# Patient Record
Sex: Female | Born: 1980 | Race: White | Hispanic: No | Marital: Single | Smoking: Never smoker
Health system: Southern US, Community
[De-identification: ages and names within clinical notes are randomized; demographics above are authoritative.]

## PROBLEM LIST (undated history)

## (undated) DIAGNOSIS — T7840XA Allergy, unspecified, initial encounter: Secondary | ICD-10-CM

## (undated) DIAGNOSIS — K760 Fatty (change of) liver, not elsewhere classified: Secondary | ICD-10-CM

## (undated) HISTORY — PX: WISDOM TOOTH EXTRACTION: SHX21

## (undated) HISTORY — DX: Allergy, unspecified, initial encounter: T78.40XA

## (undated) HISTORY — DX: Fatty (change of) liver, not elsewhere classified: K76.0

---

## 2017-11-10 ENCOUNTER — Encounter: Payer: Self-pay | Admitting: Family Medicine

## 2017-11-10 ENCOUNTER — Ambulatory Visit (INDEPENDENT_AMBULATORY_CARE_PROVIDER_SITE_OTHER): Payer: 59 | Admitting: Family Medicine

## 2017-11-10 VITALS — BP 128/80 | HR 71 | Temp 98.8°F | Ht 67.0 in | Wt 220.1 lb

## 2017-11-10 DIAGNOSIS — N926 Irregular menstruation, unspecified: Secondary | ICD-10-CM | POA: Diagnosis not present

## 2017-11-10 DIAGNOSIS — E669 Obesity, unspecified: Secondary | ICD-10-CM | POA: Diagnosis not present

## 2017-11-10 DIAGNOSIS — Z0000 Encounter for general adult medical examination without abnormal findings: Secondary | ICD-10-CM

## 2017-11-10 DIAGNOSIS — Z23 Encounter for immunization: Secondary | ICD-10-CM | POA: Diagnosis not present

## 2017-11-10 DIAGNOSIS — E66811 Obesity, class 1: Secondary | ICD-10-CM

## 2017-11-10 LAB — POCT URINE PREGNANCY: Preg Test, Ur: NEGATIVE

## 2017-11-10 NOTE — Progress Notes (Signed)
Chief Complaint  Patient presents with  . New Patient (Initial Visit)     Well Woman Yesenia Cunningham is here for a complete physical.   Her last physical was >1 year ago.  Current diet: in general, a "healthy" diet. Current exercise: None. Contraception? No, trying to get pregnant Patient's last menstrual period was 10/08/2017 (exact date). Seatbelt? Yes  Health Maintenance Pap/HPV- Yes 1 year ago Tetanus- No HIV screening- Yes 9 yrs ago  Past Medical History:  Diagnosis Date  . Allergy      History reviewed. No pertinent surgical history.  Medications  Takes no meds routinely.   Allergies Allergies  Allergen Reactions  . Nsaids     Stevens-Johnson Syndrome  . Bacitracin Other (See Comments)  . Sulfa Antibiotics Nausea And Vomiting    Review of Systems: Constitutional:  no unexpected weight changes Eye:  no recent significant change in vision Ear/Nose/Mouth/Throat:  Ears:  no tinnitus or vertigo and no recent change in hearing Nose/Mouth/Throat:  no complaints of nasal congestion, no sore throat Cardiovascular: no chest pain Respiratory:  no cough and no shortness of breath Gastrointestinal:  no abdominal pain, no change in bowel habits GU:  Female: negative for dysuria or pelvic pain Musculoskeletal/Extremities: +heel pain b/l; otherwise no pain of the joints Integumentary (Skin/Breast):  no abnormal skin lesions reported Neurologic:  no headaches Endocrine:  denies fatigue Hematologic/Lymphatic:  No areas of easy bleeding  Exam BP 128/80 (BP Location: Left Arm, Patient Position: Sitting, Cuff Size: Normal)   Pulse 71   Temp 98.8 F (37.1 C) (Oral)   Ht 5\' 7"  (1.702 m)   Wt 220 lb 2 oz (99.8 kg)   LMP 10/08/2017 (Exact Date)   SpO2 98%   BMI 34.48 kg/m  General:  well developed, well nourished, in no apparent distress Skin:  no significant moles, warts, or growths Head:  no masses, lesions, or tenderness Eyes:  pupils equal and round, sclera  anicteric without injection Ears:  canals without lesions, TMs shiny without retraction, no obvious effusion, no erythema Nose:  nares patent, septum midline, mucosa normal, and no drainage or sinus tenderness Throat/Pharynx:  lips and gingiva without lesion; tongue and uvula midline; non-inflamed pharynx; no exudates or postnasal drainage Neck: neck supple without adenopathy, thyromegaly, or masses Lungs:  clear to auscultation, breath sounds equal bilaterally, no respiratory distress Cardio:  regular rate and rhythm, no bruits, no LE edema Abdomen:  abdomen soft, nontender; bowel sounds normal; no masses or organomegaly Genital: Defer to GYN Musculoskeletal: +ttp over b/l calcaneal tendons, worse on L today; otherwise symmetrical muscle groups noted without atrophy or deformity Extremities:  no clubbing, cyanosis, or edema, no deformities, no skin discoloration Neuro:  gait normal; deep tendon reflexes normal and symmetric Psych: well oriented with normal range of affect and appropriate judgment/insight  Assessment and Plan  Well adult exam - Plan: Comprehensive metabolic panel, CBC, Lipid panel  Obesity (BMI 30.0-34.9)   Well 37 y.o. female. Counseled on diet and exercise. Other orders as above. Follow up 1 yr. The patient voiced understanding and agreement to the plan.  Jilda Roche [place], DO 11/10/17 3:15 PM

## 2017-11-10 NOTE — Progress Notes (Signed)
Pre visit review using our clinic review tool, if applicable. No additional management support is needed unless otherwise documented below in the visit note. 

## 2017-11-10 NOTE — Addendum Note (Signed)
Addended by: Scharlene Gloss B on: 11/10/2017 03:54 PM   Modules accepted: Orders

## 2017-11-10 NOTE — Patient Instructions (Addendum)
Give [place] 2-3 business days to get the results of your labs back.   Aim to do some physical exertion for 150 minutes per week. This is typically divided into 5 days per week, 30 minutes per day. The activity should be enough to get your heart rate up. Anything is better than nothing if you have time constraints.   Keep the diet clean.  Call Center for [facility] at [facility] at [phone] for an appointment.  They are located at [address], [address], [place], [place], [address] (right across the hall from our office).  [employer] on your toenails daily. This can take 9-15 months to finally work.   Let us know if you need anything.   Achilles Tendinitis Rehab It is normal to feel mild stretching, pulling, tightness, or discomfort as you do these exercises, but you should stop right away if you feel sudden pain or your pain gets worse.   Stretching and range of motion exercises These exercises warm up your muscles and joints and improve the movement and flexibility of your ankle. These exercises also help to relieve pain, numbness, and tingling. Exercise A: Standing wall calf stretch, knee straight  1. Stand with your hands against a wall. 2. Extend your affected leg behind you and bend your front knee slightly. Keep both of your heels on the floor. 3. Point the toes of your back foot slightly inward. 4. Keeping your heels on the floor and your back knee straight, shift your weight toward the wall. Do not allow your back to arch. You should feel a gentle stretch in your calf. 5. Hold this position for seconds. Repeat 2 times. Complete this stretch 3 times per week Exercise B: Standing wall calf stretch, knee bent 1. Stand with your hands against a wall. 2. Extend your affected leg behind you, and bend your front knee slightly. Keep both of your heels on the floor. 3. Point the toes of your back foot slightly inward. 4. Keeping your heels on the floor, unlock  your back knee so that it is bent. You should feel a gentle stretch deep in your calf. 5. Hold this position for 30 seconds. Repeat 2 times. Complete this stretch 3 times per week.  Strengthening exercises These exercises build strength and control of your ankle. Endurance is the ability to use your muscles for a long time, even after they get tired. Exercise C: Plantar flexion with band  1. Sit on the floor with your affected leg extended. You may put a pillow under your calf to give your foot more room to move. 2. Loop a rubber exercise band or tube around the ball of your affected foot. The ball of your foot is on the walking surface, right under your toes. The band or tube should be slightly tense when your foot is relaxed. If the band or tube slips, you can put on your shoe or put a washcloth between the band and your foot to help it stay in place. 3. Slowly point your toes downward, pushing them away from you. 4. Hold this position for 10 seconds. 5. Slowly release the tension in the band or tube, controlling smoothly until your foot is back to the starting position. Repeat 2 times. Complete this exercise 3 times per week. Exercise D: Heel raise with eccentric lower  1. Stand on a step with the balls of your feet. The ball of your foot is on the walking surface, right under  your toes. ? Do not put your heels on the step. ? For balance, rest your hands on the wall or on a railing. 2. Rise up onto the balls of your feet. 3. Keeping your heels up, shift all of your weight to your affected leg and pick up your other leg. 4. Slowly lower your affected leg so your heel drops below the level of the step. 5. Put down your foot. If told by your health care provider, build up to:  3 sets of 15 repetitions while keeping your knees straight.  3 sets of 15 repetitions while keeping your knees bent as far as told by your health care provider.  Complete this exercise 3 times per week. If this  exercise is too easy, try doing it while wearing a backpack with weights in it. Balance exercises These exercises improve or maintain your balance. Balance is important in preventing falls. Exercise E: Single leg stand 1. Without shoes, stand near a railing or in a door frame. Hold on to the railing or door frame as needed. 2. Stand on your affected foot. Keep your big toe down on the floor and try to keep your arch lifted. 3. Hold this position for 10 seconds. Repeat 2 times. Complete this exercise 3 times per week. If this exercise is too easy, you can try it with your eyes closed or while standing on a pillow. Make sure you discuss any questions you have with your health care provider. Document Released: 07/23/2004 Document Revised: 08/29/2015 Document Reviewed: 08/28/2014 Elsevier Interactive Patient Education  [employer].

## 2017-11-11 LAB — COMPREHENSIVE METABOLIC PANEL
ALK PHOS: 46 U/L (ref 39–117)
ALT: 14 U/L (ref 0–35)
AST: 15 U/L (ref 0–37)
Albumin: 4.3 g/dL (ref 3.5–5.2)
BILIRUBIN TOTAL: 0.3 mg/dL (ref 0.2–1.2)
BUN: 20 mg/dL (ref 6–23)
CO2: 27 mEq/L (ref 19–32)
Calcium: 9.1 mg/dL (ref 8.4–10.5)
Chloride: 103 mEq/L (ref 96–112)
Creatinine, Ser: 1.02 mg/dL (ref 0.40–1.20)
GFR: 64.29 mL/min (ref >60.00)
Glucose, Bld: 89 mg/dL (ref 70–99)
Potassium: 4.2 mEq/L (ref 3.5–5.1)
Sodium: 138 mEq/L (ref 135–145)
Total Protein: 7 g/dL (ref 6.0–8.3)

## 2017-11-11 LAB — LIPID PANEL
CHOLESTEROL: 165 mg/dL (ref 0–200)
HDL: 58.5 mg/dL (ref >39.00)
LDL Cholesterol: 74 mg/dL (ref 0–99)
NonHDL: 106.45
TRIGLYCERIDES: 162 mg/dL — AB (ref 0.0–149.0)
Total CHOL/HDL Ratio: 3
VLDL: 32.4 mg/dL (ref 0.0–40.0)

## 2017-11-11 LAB — CBC
HCT: 43.5 % (ref 36.0–46.0)
HEMOGLOBIN: 14.4 g/dL (ref 12.0–15.0)
MCHC: 33.2 g/dL (ref 30.0–36.0)
MCV: 91 fl (ref 78.0–100.0)
Platelets: 362 10*3/uL (ref 150.0–400.0)
RBC: 4.78 Mil/uL (ref 3.87–5.11)
RDW: 13.9 % (ref 11.5–15.5)
WBC: 7.6 10*3/uL (ref 4.0–10.5)

## 2018-11-16 ENCOUNTER — Encounter: Payer: 59 | Admitting: Family Medicine

## 2018-11-22 ENCOUNTER — Encounter: Payer: 59 | Admitting: Family Medicine

## 2019-01-17 ENCOUNTER — Other Ambulatory Visit: Payer: Self-pay

## 2019-01-17 ENCOUNTER — Telehealth: Payer: Self-pay | Admitting: Family Medicine

## 2019-01-17 ENCOUNTER — Encounter: Payer: Self-pay | Admitting: Family Medicine

## 2019-01-17 ENCOUNTER — Ambulatory Visit (INDEPENDENT_AMBULATORY_CARE_PROVIDER_SITE_OTHER): Payer: 59 | Admitting: Family Medicine

## 2019-01-17 VITALS — BP 138/88 | HR 72 | Temp 96.5°F | Ht 67.0 in | Wt 231.0 lb

## 2019-01-17 DIAGNOSIS — Z23 Encounter for immunization: Secondary | ICD-10-CM | POA: Diagnosis not present

## 2019-01-17 DIAGNOSIS — Z0000 Encounter for general adult medical examination without abnormal findings: Secondary | ICD-10-CM

## 2019-01-17 LAB — COMPREHENSIVE METABOLIC PANEL
ALT: 18 U/L (ref 0–35)
AST: 15 U/L (ref 0–37)
Albumin: 4 g/dL (ref 3.5–5.2)
Alkaline Phosphatase: 60 U/L (ref 39–117)
BUN: 17 mg/dL (ref 6–23)
CO2: 28 mEq/L (ref 19–32)
Calcium: 9 mg/dL (ref 8.4–10.5)
Chloride: 103 mEq/L (ref 96–112)
Creatinine, Ser: 0.78 mg/dL (ref 0.40–1.20)
GFR: 81.93 mL/min (ref >60.00)
Glucose, Bld: 97 mg/dL (ref 70–99)
Potassium: 4.4 mEq/L (ref 3.5–5.1)
Sodium: 137 mEq/L (ref 135–145)
Total Bilirubin: 0.4 mg/dL (ref 0.2–1.2)
Total Protein: 6.7 g/dL (ref 6.0–8.3)

## 2019-01-17 LAB — CBC
HCT: 42.5 % (ref 36.0–46.0)
Hemoglobin: 14.2 g/dL (ref 12.0–15.0)
MCHC: 33.4 g/dL (ref 30.0–36.0)
MCV: 89.5 fl (ref 78.0–100.0)
Platelets: 304 10*3/uL (ref 150.0–400.0)
RBC: 4.75 Mil/uL (ref 3.87–5.11)
RDW: 13.7 % (ref 11.5–15.5)
WBC: 6 10*3/uL (ref 4.0–10.5)

## 2019-01-17 LAB — LIPID PANEL
Cholesterol: 156 mg/dL (ref 0–200)
HDL: 47.1 mg/dL (ref >39.00)
LDL Cholesterol: 92 mg/dL (ref 0–99)
NonHDL: 109.2
Total CHOL/HDL Ratio: 3
Triglycerides: 84 mg/dL (ref 0.0–149.0)
VLDL: 16.8 mg/dL (ref 0.0–40.0)

## 2019-01-17 NOTE — Progress Notes (Signed)
Chief Complaint  Patient presents with  . Annual Exam     Well Woman Yesenia Cunningham is here for a complete physical.   Her last physical was >1 year ago.  Current diet: in general, a "healthy" diet. Currently doing keto.  Current exercise: walking a lot at work. No LMP recorded. Seatbelt? Yes  Health Maintenance Pap/HPV- Yes Tetanus- Yes HIV screening- Yes  Past Medical History:  Diagnosis Date  . Allergy      Past Surgical History:  Procedure Laterality Date  . WISDOM TOOTH EXTRACTION      Medications  Takes no meds routinely.   Allergies Allergies  Allergen Reactions  . Nsaids     Stevens-Johnson Syndrome  . Bacitracin Other (See Comments)  . Sulfa Antibiotics Nausea And Vomiting    Review of Systems: Constitutional:  no unexpected weight changes Eye:  no recent significant change in vision Ear/Nose/Mouth/Throat:  Ears:  no tinnitus or vertigo and no recent change in hearing Nose/Mouth/Throat:  no complaints of nasal congestion, no sore throat Cardiovascular: no chest pain Respiratory:  no cough and no shortness of breath Gastrointestinal:  no abdominal pain, no change in bowel habits  GU:  Female: negative for dysuria or pelvic pain Musculoskeletal/Extremities:  no pain of the joints Integumentary (Skin/Breast):  no abnormal skin lesions reported Neurologic:  no headaches Endocrine:  denies fatigue Hematologic/Lymphatic:  No areas of easy bleeding  Exam BP 138/88 (BP Location: Left Arm, Patient Position: Sitting, Cuff Size: Normal)   Pulse 72   Temp (!) 96.5 F (35.8 C) (Temporal)   Ht 5' 7"  (1.702 m)   Wt 231 lb (104.8 kg)   SpO2 96%   BMI 36.18 kg/m  General:  well developed, well nourished, in no apparent distress Skin:  no significant moles, warts, or growths Head:  no masses, lesions, or tenderness Eyes:  pupils equal and round, sclera anicteric without injection Ears:  canals without lesions, TMs shiny without retraction, no obvious  effusion, no erythema Nose:  nares patent, septum midline, mucosa normal, and no drainage or sinus tenderness Throat/Pharynx:  lips and gingiva without lesion; tongue and uvula midline; non-inflamed pharynx; no exudates or postnasal drainage Neck: neck supple without adenopathy, thyromegaly, or masses Lungs:  clear to auscultation, breath sounds equal bilaterally, no respiratory distress Cardio:  regular rate and rhythm, no bruits, no LE edema Abdomen:  abdomen soft, nontender; bowel sounds normal; no masses or organomegaly Genital: Defer to GYN Musculoskeletal:  symmetrical muscle groups noted without atrophy or deformity Extremities:  no clubbing, cyanosis, or edema, no deformities, no skin discoloration Neuro:  gait normal; deep tendon reflexes normal and symmetric Psych: well oriented with normal range of affect and appropriate judgment/insight  Assessment and Plan  Well adult exam - Plan: CBC, Comp Met (CMET), Lipid Profile  Need for influenza vaccination - Plan: Flu Vaccine QUAD 6+ mos PF IM (Fluarix Quad PF)   Well 39 y.o. female. Counseled on diet and exercise. Other orders as above. Follow up in 1 yr or prn. The patient voiced understanding and agreement to the plan.  [place], DO 01/17/19 9:27 AM

## 2019-01-17 NOTE — Patient Instructions (Addendum)
Give [place] 2-3 business days to get the results of your labs back.   Keep the diet clean and stay active.  Consider yoga.   Let us know if you need anything.

## 2019-01-17 NOTE — Telephone Encounter (Signed)
Copied from CRM [id number]. Topic: General - Other >> Jan 17, 2019  5:07 PM Marylen Ponto wrote: Reason for CRM: Pt stated she just received a call from [place] Pt requests call back. Cb# [phone]   See result notes

## 2019-05-10 DIAGNOSIS — M9901 Segmental and somatic dysfunction of cervical region: Secondary | ICD-10-CM | POA: Diagnosis not present

## 2019-05-10 DIAGNOSIS — M5386 Other specified dorsopathies, lumbar region: Secondary | ICD-10-CM | POA: Diagnosis not present

## 2019-05-10 DIAGNOSIS — M531 Cervicobrachial syndrome: Secondary | ICD-10-CM | POA: Diagnosis not present

## 2019-05-10 DIAGNOSIS — M9902 Segmental and somatic dysfunction of thoracic region: Secondary | ICD-10-CM | POA: Diagnosis not present

## 2019-06-14 DIAGNOSIS — M5386 Other specified dorsopathies, lumbar region: Secondary | ICD-10-CM | POA: Diagnosis not present

## 2019-06-14 DIAGNOSIS — M9902 Segmental and somatic dysfunction of thoracic region: Secondary | ICD-10-CM | POA: Diagnosis not present

## 2019-06-14 DIAGNOSIS — M531 Cervicobrachial syndrome: Secondary | ICD-10-CM | POA: Diagnosis not present

## 2019-06-14 DIAGNOSIS — M9901 Segmental and somatic dysfunction of cervical region: Secondary | ICD-10-CM | POA: Diagnosis not present

## 2019-07-12 DIAGNOSIS — M531 Cervicobrachial syndrome: Secondary | ICD-10-CM | POA: Diagnosis not present

## 2019-07-12 DIAGNOSIS — M9902 Segmental and somatic dysfunction of thoracic region: Secondary | ICD-10-CM | POA: Diagnosis not present

## 2019-07-12 DIAGNOSIS — M9901 Segmental and somatic dysfunction of cervical region: Secondary | ICD-10-CM | POA: Diagnosis not present

## 2019-07-12 DIAGNOSIS — M5386 Other specified dorsopathies, lumbar region: Secondary | ICD-10-CM | POA: Diagnosis not present

## 2019-08-09 DIAGNOSIS — M9902 Segmental and somatic dysfunction of thoracic region: Secondary | ICD-10-CM | POA: Diagnosis not present

## 2019-08-09 DIAGNOSIS — M5386 Other specified dorsopathies, lumbar region: Secondary | ICD-10-CM | POA: Diagnosis not present

## 2019-08-09 DIAGNOSIS — M531 Cervicobrachial syndrome: Secondary | ICD-10-CM | POA: Diagnosis not present

## 2019-08-09 DIAGNOSIS — M9901 Segmental and somatic dysfunction of cervical region: Secondary | ICD-10-CM | POA: Diagnosis not present

## 2019-09-14 DIAGNOSIS — M531 Cervicobrachial syndrome: Secondary | ICD-10-CM | POA: Diagnosis not present

## 2019-09-14 DIAGNOSIS — M5386 Other specified dorsopathies, lumbar region: Secondary | ICD-10-CM | POA: Diagnosis not present

## 2019-09-14 DIAGNOSIS — M9902 Segmental and somatic dysfunction of thoracic region: Secondary | ICD-10-CM | POA: Diagnosis not present

## 2019-09-14 DIAGNOSIS — M9901 Segmental and somatic dysfunction of cervical region: Secondary | ICD-10-CM | POA: Diagnosis not present

## 2019-10-12 DIAGNOSIS — M9902 Segmental and somatic dysfunction of thoracic region: Secondary | ICD-10-CM | POA: Diagnosis not present

## 2019-10-12 DIAGNOSIS — M5386 Other specified dorsopathies, lumbar region: Secondary | ICD-10-CM | POA: Diagnosis not present

## 2019-10-12 DIAGNOSIS — M9901 Segmental and somatic dysfunction of cervical region: Secondary | ICD-10-CM | POA: Diagnosis not present

## 2019-10-12 DIAGNOSIS — M531 Cervicobrachial syndrome: Secondary | ICD-10-CM | POA: Diagnosis not present

## 2019-11-09 ENCOUNTER — Other Ambulatory Visit: Payer: Self-pay

## 2019-11-09 ENCOUNTER — Ambulatory Visit
Admission: EM | Admit: 2019-11-09 | Discharge: 2019-11-09 | Disposition: A | Discharge status: Home / Self Care | Payer: BC Managed Care – PPO | Attending: Emergency Medicine | Admitting: Emergency Medicine

## 2019-11-09 DIAGNOSIS — R1013 Epigastric pain: Secondary | ICD-10-CM

## 2019-11-09 DIAGNOSIS — R112 Nausea with vomiting, unspecified: Secondary | ICD-10-CM

## 2019-11-09 MED ORDER — SUCRALFATE 1 GM/10ML PO SUSP: 1.0000 g | Freq: Three times a day (TID) | ORAL | 0 refills | Status: DC

## 2019-11-09 MED ORDER — FAMOTIDINE 40 MG PO TABS: 40.0000 mg | ORAL_TABLET | Freq: Every day | ORAL | 0 refills | Status: AC

## 2019-11-09 MED ORDER — ONDANSETRON 4 MG PO TBDP: 4.0000 mg | ORAL_TABLET | Freq: Three times a day (TID) | ORAL | 0 refills | Status: DC | PRN

## 2019-11-09 NOTE — ED Provider Notes (Signed)
[facility]    CSN: [id number] Arrival date & time: 11/09/19  1608      History   Chief Complaint Chief Complaint  Patient presents with  . Abdominal Pain    since last night    HPI Yesenia Cunningham is a 39 y.o. female  Presenting for epigastric discomfort described as burning, nonradiating.  States it began last night, though had been mild in the preceding month.  Endorsing history of reflux; does not take anything for this routinely.  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  No change in diet, lifestyle, medications.  Has been vomiting stomach acid: Denies green emesis, coffee-ground emesis (does have history thereof).  No chest pain, difficulty breathing.  Has not taken anything for this.  Past Medical History:  Diagnosis Date  . Allergy     Patient Active Problem List   Diagnosis Date Noted  . Obesity (BMI 30.0-34.9) 11/10/2017    Past Surgical History:  Procedure Laterality Date  . WISDOM TOOTH EXTRACTION      OB History   No obstetric history on file.      Home Medications    Prior to Admission medications   Medication Sig Start Date End Date Taking? Authorizing Provider  famotidine (PEPCID) 40 MG tablet Take 1 tablet (40 mg total) by mouth daily. 11/09/19   Hall-Potvin, [place], PA-C  ondansetron (ZOFRAN ODT) 4 MG disintegrating tablet Take 1 tablet (4 mg total) by mouth every 8 (eight) hours as needed for nausea or vomiting. 11/09/19   Hall-Potvin, [place], PA-C  sucralfate (CARAFATE) 1 GM/10ML suspension Take 10 mLs (1 g total) by mouth 4 (four) times daily -  with meals and at bedtime. 11/09/19   Hall-Potvin, [place], PA-C    Family History Family History  Problem Relation Age of Onset  . Hypertension Mother   . Cancer Father        kidney cancer  . Heart disease Father   . Cancer Maternal Grandmother        uterine cancer  . Heart attack Maternal Grandfather   . Stroke Paternal Grandmother      Social History Social History   Tobacco Use  . Smoking status: Never Smoker  . Smokeless tobacco: Never Used  Vaping Use  . Vaping Use: Never used  Substance Use Topics  . Alcohol use: Never  . Drug use: Never     Allergies   Nsaids, Bacitracin, and Sulfa antibiotics   Review of Systems Review of Systems  Constitutional: Negative for fatigue and fever.  HENT: Negative for ear pain, sinus pain, sore throat and voice change.   Eyes: Negative for pain, redness and visual disturbance.  Respiratory: Negative for cough and shortness of breath.   Cardiovascular: Negative for chest pain and palpitations.  Gastrointestinal: Positive for abdominal pain, nausea and vomiting. Negative for abdominal distention, blood in stool and diarrhea.  Genitourinary: Negative for dysuria, frequency, pelvic pain, urgency, vaginal bleeding, vaginal discharge and vaginal pain.  Musculoskeletal: Negative for arthralgias and myalgias.  Skin: Negative for rash and wound.  Neurological: Negative for syncope and headaches.     Physical Exam Triage Vital Signs ED Triage Vitals  Enc Vitals Group     BP      Pulse      Resp      Temp      Temp src      SpO2      Weight      Height  Head Circumference      Peak Flow      Pain Score      Pain Loc      Pain Edu?      Excl. in GC?    No data found.  Updated Vital Signs BP (!) 168/117 (BP Location: Left Arm)   Pulse 100   Temp 99 F (37.2 C) (Oral)   Resp 18   LMP 10/23/2019 (Exact Date)   SpO2 98%   Visual Acuity Right Eye Distance:   Left Eye Distance:   Bilateral Distance:    Right Eye Near:   Left Eye Near:    Bilateral Near:     Physical Exam Constitutional:      General: She is not in acute distress.    Appearance: She is well-developed. She is obese. She is ill-appearing. She is not toxic-appearing.  HENT:     Head: Normocephalic and atraumatic.  Eyes:     General: No scleral icterus.    Pupils: Pupils are  equal, round, and reactive to light.  Cardiovascular:     Rate and Rhythm: Normal rate and regular rhythm.  Pulmonary:     Effort: Pulmonary effort is normal. No respiratory distress.     Breath sounds: No wheezing, rhonchi or rales.  Abdominal:     General: Bowel sounds are normal. There is no distension or abdominal bruit.     Palpations: Abdomen is soft. There is no hepatomegaly, splenomegaly or pulsatile mass.     Tenderness: There is abdominal tenderness in the epigastric area. There is no guarding or rebound. Negative signs include Murphy's sign, Rovsing's sign and McBurney's sign.     Hernia: No hernia is present.  Skin:    General: Skin is warm.     Coloration: Skin is not jaundiced, mottled or pale.     Findings: No rash.  Neurological:     Mental Status: She is alert and oriented to person, place, and time.      UC Treatments / Results  Labs (all labs ordered are listed, but only abnormal results are displayed) Labs Reviewed - No data to display  EKG   Radiology No results found.  Procedures Procedures (including critical care time)  Medications Ordered in UC Medications - No data to display  Initial Impression / Assessment and Plan / UC Course  I have reviewed the triage vital signs and the nursing notes.  Pertinent labs & imaging results that were available during my care of the patient were reviewed by me and considered in my medical decision making (see chart for details).     Patient hypertensive, EKG done office, without previous to compare: NSR with ventricular 89 bpm.  No QTC prolongation, ST elevation or depression.  Nonacute.  Patient denying history of PUD, abdominal surgery: Low concern for obstruction, ulcer.  Will treat for gastritis as below, likely second to uncontrolled GERD.  ER return precautions discussed, pt verbalized understanding and is agreeable to plan. Final Clinical Impressions(s) / UC Diagnoses   Final diagnoses:  Nausea and  vomiting, intractability of vomiting not specified, unspecified vomiting type  Epigastric pain   Discharge Instructions   None    ED Prescriptions    Medication Sig Dispense Auth. Provider   sucralfate (CARAFATE) 1 GM/10ML suspension Take 10 mLs (1 g total) by mouth 4 (four) times daily -  with meals and at bedtime. 420 mL Hall-Potvin, [place], PA-C   famotidine (PEPCID) 40 MG tablet Take 1 tablet (40  mg total) by mouth daily. 30 tablet Hall-Potvin, [place], PA-C   ondansetron (ZOFRAN ODT) 4 MG disintegrating tablet Take 1 tablet (4 mg total) by mouth every 8 (eight) hours as needed for nausea or vomiting. 21 tablet Hall-Potvin, [place], PA-C     PDMP not reviewed this encounter.   Hall-Potvin, [place], [place] 11/10/19 1054

## 2019-11-09 NOTE — ED Triage Notes (Signed)
Pt states she has had acid reflux issues for the last month and it resolved and then began again last night and has been worsening. Pt states she has been vomiting bile today. Pt also complains of ankle swelling. Pt is aox4 and ambulatory.

## 2019-11-15 ENCOUNTER — Ambulatory Visit (INDEPENDENT_AMBULATORY_CARE_PROVIDER_SITE_OTHER): Payer: BC Managed Care – PPO | Admitting: Family Medicine

## 2019-11-15 ENCOUNTER — Other Ambulatory Visit: Payer: Self-pay

## 2019-11-15 ENCOUNTER — Encounter: Payer: Self-pay | Admitting: Family Medicine

## 2019-11-15 VITALS — BP 160/120 | HR 80 | Temp 98.3°F | Ht 67.0 in | Wt 254.1 lb

## 2019-11-15 DIAGNOSIS — R030 Elevated blood-pressure reading, without diagnosis of hypertension: Secondary | ICD-10-CM | POA: Diagnosis not present

## 2019-11-15 DIAGNOSIS — K219 Gastro-esophageal reflux disease without esophagitis: Secondary | ICD-10-CM

## 2019-11-15 DIAGNOSIS — K920 Hematemesis: Secondary | ICD-10-CM | POA: Diagnosis not present

## 2019-11-15 DIAGNOSIS — R112 Nausea with vomiting, unspecified: Secondary | ICD-10-CM

## 2019-11-15 LAB — COMPREHENSIVE METABOLIC PANEL
ALT: 80 U/L — ABNORMAL HIGH (ref 0–35)
AST: 43 U/L — ABNORMAL HIGH (ref 0–37)
Albumin: 4 g/dL (ref 3.5–5.2)
Alkaline Phosphatase: 65 U/L (ref 39–117)
BUN: 10 mg/dL (ref 6–23)
CO2: 30 mEq/L (ref 19–32)
Calcium: 8.8 mg/dL (ref 8.4–10.5)
Chloride: 101 mEq/L (ref 96–112)
Creatinine, Ser: 0.78 mg/dL (ref 0.40–1.20)
GFR: 94.94 mL/min (ref >60.00)
Glucose, Bld: 88 mg/dL (ref 70–99)
Potassium: 4.3 mEq/L (ref 3.5–5.1)
Sodium: 138 mEq/L (ref 135–145)
Total Bilirubin: 0.4 mg/dL (ref 0.2–1.2)
Total Protein: 6.6 g/dL (ref 6.0–8.3)

## 2019-11-15 LAB — TSH: TSH: 2.15 u[IU]/mL (ref 0.35–4.50)

## 2019-11-15 LAB — CBC
HCT: 43.1 % (ref 36.0–46.0)
Hemoglobin: 14.1 g/dL (ref 12.0–15.0)
MCHC: 32.8 g/dL (ref 30.0–36.0)
MCV: 89.8 fl (ref 78.0–100.0)
Platelets: 294 10*3/uL (ref 150.0–400.0)
RBC: 4.8 Mil/uL (ref 3.87–5.11)
RDW: 14.1 % (ref 11.5–15.5)
WBC: 7.7 10*3/uL (ref 4.0–10.5)

## 2019-11-15 LAB — HEMOGLOBIN A1C: Hgb A1c MFr Bld: 6 % (ref 4.6–6.5)

## 2019-11-15 LAB — T4, FREE: Free T4: 0.98 ng/dL (ref 0.60–1.60)

## 2019-11-15 MED ORDER — PANTOPRAZOLE SODIUM 40 MG PO TBEC: 40.0000 mg | DELAYED_RELEASE_TABLET | Freq: Every day | ORAL | 3 refills | Status: DC

## 2019-11-15 MED ORDER — AMLODIPINE BESYLATE 5 MG PO TABS: 5.0000 mg | ORAL_TABLET | Freq: Every day | ORAL | 3 refills | Status: DC

## 2019-11-15 NOTE — Progress Notes (Signed)
Chief Complaint  Patient presents with  . Gastroesophageal Reflux    since August  . Hypertension  . Leg Swelling    Yesenia Cunningham is here for abdominal pain.  Duration: 2.5 mo Has had 4 attacks since then lasting a couple days where she cannot eat anything, has strong attacks of acid and nausea/vomiting.  Nighttime awakenings? Yes Bleeding? Yes Weight loss? No Palliation: nothing she can tell Provocation: no known triggers Associated symptoms: nausea and vomiting Denies: fever Treatment to date: Zofran, Pepcid, carafate  Past Medical History:  Diagnosis Date  . Allergy     BP (!) 160/120 (BP Location: Left Arm, Patient Position: Sitting, Cuff Size: Normal)   Pulse 80   Temp 98.3 F (36.8 C) (Oral)   Ht 5\' 7"  (1.702 m)   Wt 254 lb 2 oz (115.3 kg)   LMP 10/23/2019 (Exact Date)   SpO2 100%   BMI 39.80 kg/m  Gen.: Awake, alert, appears stated age HEENT: Mucous membranes moist without mucosal lesions Heart: Regular rate and rhythm without murmurs Lungs: Clear auscultation bilaterally, no rales or wheezing, normal effort without accessory muscle use. Abdomen: Bowel sounds are present. Abdomen is soft, nontender, nondistended, no masses or organomegaly. Negative Murphy's, Rovsing's, McBurney's, and Carnett's sign. Psych: Age appropriate judgment and insight. Normal mood and affect.  Coffee ground emesis - Plan: Ambulatory referral to Gastroenterology, CBC  Gastroesophageal reflux disease, unspecified whether esophagitis present - Plan: Ambulatory referral to Gastroenterology  Elevated blood pressure reading - Plan: TSH, T4, free, Comprehensive metabolic panel  Nausea and vomiting, intractability of vomiting not specified, unspecified vomiting type - Plan: Hemoglobin A1c  Needs to see GI. Will ck some basic labs. Start Protonix 40 mg/d.  I am concerned with her BP. Will start Norvasc and rec she get a home BP monitor. Ck at home and follow up here in 2 weeks. Pt voiced  understanding and agreement to the plan.  10/25/2019 [place], DO 11/15/19 10:03 AM

## 2019-11-15 NOTE — Patient Instructions (Signed)
Give Yesenia Cunningham 2-3 business days to get the results of your labs back.   Keep the diet clean and stay active.  If you do not hear anything about your referral in the next 1-2 weeks, call our office and ask for an update.  Get a home blood pressure monitor.  Check your blood pressures 2-3 times per week, alternating the time of day you check it. If it is high, considering waiting 1-2 minutes and rechecking. If it gets higher, your anxiety is likely creeping up and we should avoid rechecking.   Let Yesenia Cunningham know if you need anything.

## 2019-11-16 ENCOUNTER — Encounter: Payer: Self-pay | Admitting: Gastroenterology

## 2019-11-16 DIAGNOSIS — M5386 Other specified dorsopathies, lumbar region: Secondary | ICD-10-CM | POA: Diagnosis not present

## 2019-11-16 DIAGNOSIS — M9901 Segmental and somatic dysfunction of cervical region: Secondary | ICD-10-CM | POA: Diagnosis not present

## 2019-11-16 DIAGNOSIS — M531 Cervicobrachial syndrome: Secondary | ICD-10-CM | POA: Diagnosis not present

## 2019-11-16 DIAGNOSIS — M9902 Segmental and somatic dysfunction of thoracic region: Secondary | ICD-10-CM | POA: Diagnosis not present

## 2019-11-29 ENCOUNTER — Other Ambulatory Visit: Payer: Self-pay

## 2019-11-29 ENCOUNTER — Ambulatory Visit (INDEPENDENT_AMBULATORY_CARE_PROVIDER_SITE_OTHER): Payer: BC Managed Care – PPO | Admitting: Family Medicine

## 2019-11-29 ENCOUNTER — Encounter: Payer: Self-pay | Admitting: Family Medicine

## 2019-11-29 VITALS — BP 142/106 | HR 91 | Temp 99.8°F | Ht 67.0 in | Wt 255.5 lb

## 2019-11-29 DIAGNOSIS — I10 Essential (primary) hypertension: Secondary | ICD-10-CM | POA: Diagnosis not present

## 2019-11-29 MED ORDER — LABETALOL HCL 200 MG PO TABS: 200.0000 mg | ORAL_TABLET | Freq: Two times a day (BID) | ORAL | 1 refills | Status: DC

## 2019-11-29 MED ORDER — HYDRALAZINE HCL 25 MG PO TABS: 25.0000 mg | ORAL_TABLET | Freq: Two times a day (BID) | ORAL | 0 refills | Status: DC

## 2019-11-29 NOTE — Progress Notes (Signed)
Chief Complaint  Patient presents with  . Hypertension    Subjective Yesenia Cunningham is a 39 y.o. female who presents for hypertension follow up. She does monitor home blood pressures. Blood pressures ranging from 150's/110's on average. She is compliant with medication- Norvasc 5 mg/d. Patient has these side effects of medication: drowsiness She is sometimes adhering to a healthy diet overall. Current exercise: some walking, has an elliptical machine   Past Medical History:  Diagnosis Date  . Allergy     Exam BP (!) 142/106 (BP Location: Left Arm, Patient Position: Sitting, Cuff Size: Large)   Pulse 91   Temp 99.8 F (37.7 C) (Oral)   Ht 5\' 7"  (1.702 m)   Wt 255 lb 8 oz (115.9 kg)   SpO2 99%   BMI 40.02 kg/m  General:  well developed, well nourished, in no apparent distress Heart: RRR, no bruits, 1+ b/l pitting LE edema tapering at distal 1/3 of tibia Lungs: clear to auscultation, no accessory muscle use Psych: well oriented with normal range of affect and appropriate judgment/insight  Essential hypertension - Plan: labetalol (NORMODYNE) 200 MG tablet, hydrALAZINE (APRESOLINE) 25 MG tablet  Status: Uncontrolled. Stop Norvasc, start hydralazine 25 mg bid (may have to increase to TID, but want to see how she does) and labetalol 200 mg bid. Monitor BP at home. Counseled on diet/exercise. Sounds like she is making better life choices.  F/u in 1 mo or prn. The patient voiced understanding and agreement to the plan.  [place], DO 11/29/19  9:00 AM

## 2019-11-29 NOTE — Patient Instructions (Signed)
I have no worries with you getting pregnant on these medications. Stop the amlodipine.   Keep the diet clean and stay active.  Check your blood pressures 2-3 times per week, alternating the time of day you check it. If it is high, considering waiting 1-2 minutes and rechecking. If it gets higher, your anxiety is likely creeping up and we should avoid rechecking.   Let us know if you need anything.

## 2019-12-13 ENCOUNTER — Other Ambulatory Visit: Payer: Self-pay

## 2019-12-13 ENCOUNTER — Encounter: Payer: Self-pay | Admitting: Family Medicine

## 2019-12-13 ENCOUNTER — Ambulatory Visit (INDEPENDENT_AMBULATORY_CARE_PROVIDER_SITE_OTHER): Payer: BC Managed Care – PPO | Admitting: Family Medicine

## 2019-12-13 VITALS — BP 142/102 | HR 88 | Temp 98.5°F | Ht 67.0 in | Wt 261.1 lb

## 2019-12-13 DIAGNOSIS — I10 Essential (primary) hypertension: Secondary | ICD-10-CM | POA: Diagnosis not present

## 2019-12-13 NOTE — Patient Instructions (Signed)
For the next week, take 1/2 tab twice daily of the labetalol and then send me some blood pressure readings on MyChart.  Keep the diet clean and stay active.  Continue to monitor your blood pressure at home.   Bring your monitor to your next appointment.  Let us know if you need anything.

## 2019-12-13 NOTE — Progress Notes (Signed)
Chief Complaint  Patient presents with  . Follow-up    blood pressure medication problem    Subjective Yesenia Cunningham is a 39 y.o. female who presents for hypertension follow up. She does monitor home blood pressures. Blood pressures ranging from 140-200's/90-110's on average. She was compliant with medications- labetalol 200 mg bid and Hydralazine 25 mg bid. Patient has these side effects of medication: nausea, lightheadedness, malaise We stopped all of her meds 5 d ago.  She is adhering to a healthy diet overall. Current exercise: walking   Past Medical History:  Diagnosis Date  . Allergy     Exam BP (!) 142/102 (BP Location: Left Arm, Patient Position: Sitting, Cuff Size: Large)   Pulse 88   Temp 98.5 F (36.9 C) (Oral)   Ht 5\' 7"  (1.702 m)   Wt 261 lb 2 oz (118.4 kg)   SpO2 99%   BMI 40.90 kg/m  General:  well developed, well nourished, in no apparent distress Heart: RRR, no bruits, 2+ b/l LE edema tapering at prox 1/3 of tibia Lungs: clear to auscultation, no accessory muscle use Psych: well oriented with normal range of affect and appropriate judgment/insight  Essential hypertension  Stop hydralazine. Counseled on diet and exercise. Go back on labetalol, but take 100 mg bid for the first week and send me readings on MyChart in 1 week. F/u in 2 weeks w me, bring BP monitor. The patient voiced understanding and agreement to the plan.  [place], DO 12/13/19  3:11 PM

## 2019-12-14 DIAGNOSIS — M531 Cervicobrachial syndrome: Secondary | ICD-10-CM | POA: Diagnosis not present

## 2019-12-14 DIAGNOSIS — M9901 Segmental and somatic dysfunction of cervical region: Secondary | ICD-10-CM | POA: Diagnosis not present

## 2019-12-14 DIAGNOSIS — M9902 Segmental and somatic dysfunction of thoracic region: Secondary | ICD-10-CM | POA: Diagnosis not present

## 2019-12-14 DIAGNOSIS — M5386 Other specified dorsopathies, lumbar region: Secondary | ICD-10-CM | POA: Diagnosis not present

## 2019-12-20 ENCOUNTER — Other Ambulatory Visit: Payer: Self-pay | Admitting: Family Medicine

## 2019-12-27 ENCOUNTER — Other Ambulatory Visit: Payer: Self-pay

## 2019-12-27 ENCOUNTER — Ambulatory Visit (INDEPENDENT_AMBULATORY_CARE_PROVIDER_SITE_OTHER): Payer: BC Managed Care – PPO | Admitting: Family Medicine

## 2019-12-27 ENCOUNTER — Encounter: Payer: Self-pay | Admitting: Family Medicine

## 2019-12-27 VITALS — BP 148/100 | HR 77 | Temp 98.7°F | Ht 67.0 in | Wt 263.5 lb

## 2019-12-27 DIAGNOSIS — I10 Essential (primary) hypertension: Secondary | ICD-10-CM

## 2019-12-27 MED ORDER — PANTOPRAZOLE SODIUM 40 MG PO TBEC
40.0000 mg | DELAYED_RELEASE_TABLET | Freq: Every day | ORAL | 3 refills | Status: DC
Start: 2019-12-27 — End: 2020-04-08

## 2019-12-27 MED ORDER — LABETALOL HCL 300 MG PO TABS: 300.0000 mg | ORAL_TABLET | Freq: Two times a day (BID) | ORAL | 2 refills | Status: DC

## 2019-12-27 NOTE — Progress Notes (Signed)
Chief Complaint  Patient presents with  . Hypertension    Subjective Yesenia Cunningham is a 39 y.o. female who presents for hypertension follow up. She does monitor home blood pressures. Blood pressures ranging from 140's/100's on average. She is compliant with medication- labetalol 200 mg bid. Patient has these side effects of medication: none She is adhering to a healthy diet overall. Current exercise: walking daily    Past Medical History:  Diagnosis Date  . Allergy     Exam BP (!) 148/100 (BP Location: Left Arm, Patient Position: Sitting, Cuff Size: Large)   Pulse 77   Temp 98.7 F (37.1 C) (Oral)   Ht 5\' 7"  (1.702 m)   Wt 263 lb 8 oz (119.5 kg)   SpO2 98%   BMI 41.27 kg/m  General:  well developed, well nourished, in no apparent distress Heart: RRR, no bruits, 1+ pitting b/l LE edema Lungs: clear to auscultation, no accessory muscle use Psych: well oriented with normal range of affect and appropriate judgment/insight  Essential hypertension - Plan: labetalol (NORMODYNE) 300 MG tablet  Uncontrolled. Increase dosage of labetalol from 200 mg bid to 300 mg bid. Counseled on diet and exercise. Cont to ck BP at home, send readings in 1 week. The patient voiced understanding and agreement to the plan.  [place], DO 12/27/19  8:49 AM

## 2019-12-27 NOTE — Patient Instructions (Signed)
Keep the diet clean and stay active.  Continue checking your blood pressure at home. Send me some readings on MyChart in 2 weeks.  Let us know if you need anything.

## 2020-01-11 ENCOUNTER — Encounter: Payer: Self-pay | Admitting: Family Medicine

## 2020-01-15 DIAGNOSIS — M5386 Other specified dorsopathies, lumbar region: Secondary | ICD-10-CM | POA: Diagnosis not present

## 2020-01-15 DIAGNOSIS — M9903 Segmental and somatic dysfunction of lumbar region: Secondary | ICD-10-CM | POA: Diagnosis not present

## 2020-01-15 DIAGNOSIS — M9905 Segmental and somatic dysfunction of pelvic region: Secondary | ICD-10-CM | POA: Diagnosis not present

## 2020-01-15 DIAGNOSIS — M9901 Segmental and somatic dysfunction of cervical region: Secondary | ICD-10-CM | POA: Diagnosis not present

## 2020-01-17 ENCOUNTER — Ambulatory Visit: Payer: BC Managed Care – PPO | Admitting: Family Medicine

## 2020-01-18 ENCOUNTER — Ambulatory Visit: Payer: BC Managed Care – PPO | Admitting: Gastroenterology

## 2020-01-18 DIAGNOSIS — M9901 Segmental and somatic dysfunction of cervical region: Secondary | ICD-10-CM | POA: Diagnosis not present

## 2020-01-18 DIAGNOSIS — M9902 Segmental and somatic dysfunction of thoracic region: Secondary | ICD-10-CM | POA: Diagnosis not present

## 2020-01-18 DIAGNOSIS — M6240 Contracture of muscle, unspecified site: Secondary | ICD-10-CM | POA: Diagnosis not present

## 2020-01-18 DIAGNOSIS — M546 Pain in thoracic spine: Secondary | ICD-10-CM | POA: Diagnosis not present

## 2020-01-19 ENCOUNTER — Ambulatory Visit: Payer: 59 | Admitting: Family Medicine

## 2020-01-24 ENCOUNTER — Encounter: Payer: Self-pay | Admitting: Family Medicine

## 2020-01-24 ENCOUNTER — Ambulatory Visit (INDEPENDENT_AMBULATORY_CARE_PROVIDER_SITE_OTHER): Payer: BC Managed Care – PPO | Admitting: Family Medicine

## 2020-01-24 ENCOUNTER — Other Ambulatory Visit: Payer: Self-pay

## 2020-01-24 ENCOUNTER — Encounter: Payer: BC Managed Care – PPO | Admitting: Family Medicine

## 2020-01-24 ENCOUNTER — Other Ambulatory Visit: Payer: Self-pay | Admitting: Family Medicine

## 2020-01-24 VITALS — BP 122/78 | HR 83 | Temp 98.4°F | Ht 67.0 in | Wt 267.0 lb

## 2020-01-24 DIAGNOSIS — E785 Hyperlipidemia, unspecified: Secondary | ICD-10-CM

## 2020-01-24 DIAGNOSIS — R739 Hyperglycemia, unspecified: Secondary | ICD-10-CM

## 2020-01-24 DIAGNOSIS — Z0000 Encounter for general adult medical examination without abnormal findings: Secondary | ICD-10-CM | POA: Diagnosis not present

## 2020-01-24 DIAGNOSIS — Z1159 Encounter for screening for other viral diseases: Secondary | ICD-10-CM

## 2020-01-24 LAB — COMPREHENSIVE METABOLIC PANEL
ALT: 55 U/L — ABNORMAL HIGH (ref 0–35)
AST: 33 U/L (ref 0–37)
Albumin: 4 g/dL (ref 3.5–5.2)
Alkaline Phosphatase: 63 U/L (ref 39–117)
BUN: 11 mg/dL (ref 6–23)
CO2: 26 mEq/L (ref 19–32)
Calcium: 9.1 mg/dL (ref 8.4–10.5)
Chloride: 103 mEq/L (ref 96–112)
Creatinine, Ser: 0.83 mg/dL (ref 0.40–1.20)
GFR: 88 mL/min (ref >60.00)
Glucose, Bld: 148 mg/dL — ABNORMAL HIGH (ref 70–99)
Potassium: 4.1 mEq/L (ref 3.5–5.1)
Sodium: 135 mEq/L (ref 135–145)
Total Bilirubin: 0.4 mg/dL (ref 0.2–1.2)
Total Protein: 6.5 g/dL (ref 6.0–8.3)

## 2020-01-24 LAB — LIPID PANEL
Cholesterol: 155 mg/dL (ref 0–200)
HDL: 48.3 mg/dL (ref >39.00)
NonHDL: 107.16
Total CHOL/HDL Ratio: 3
Triglycerides: 220 mg/dL — ABNORMAL HIGH (ref 0.0–149.0)
VLDL: 44 mg/dL — ABNORMAL HIGH (ref 0.0–40.0)

## 2020-01-24 LAB — LDL CHOLESTEROL, DIRECT: Direct LDL: 87 mg/dL

## 2020-01-24 NOTE — Progress Notes (Signed)
Chief Complaint  Patient presents with  . Annual Exam     Well Woman Yesenia Cunningham is here for a complete physical.   Her last physical was >1 year ago.  Current diet: in general, diet is healthy overall Current exercise: walking. Weight is stable and she denies fatigue out of ordinary.  Seatbelt? Yes  Health Maintenance Pap/HPV- Due Mammogram- No Tetanus- Yes Hep C screening- No HIV screening- Yes  Past Medical History:  Diagnosis Date  . Allergy      Past Surgical History:  Procedure Laterality Date  . WISDOM TOOTH EXTRACTION      Medications  Current Outpatient Medications on File Prior to Visit  Medication Sig Dispense Refill  . famotidine (PEPCID) 40 MG tablet Take 1 tablet (40 mg total) by mouth daily. 30 tablet 0  . labetalol (NORMODYNE) 300 MG tablet Take 1 tablet (300 mg total) by mouth 2 (two) times daily. 60 tablet 2  . pantoprazole (PROTONIX) 40 MG tablet Take 1 tablet (40 mg total) by mouth daily. 30 tablet 3    Allergies Allergies  Allergen Reactions  . Nsaids     Stevens-Johnson Syndrome  . Bacitracin Other (See Comments)  . Sulfa Antibiotics Nausea And Vomiting    Review of Systems: Constitutional:  no unexpected weight changes Eye:  no recent significant change in vision Ear/Nose/Mouth/Throat:  Ears:  no recent change in hearing Nose/Mouth/Throat:  no complaints of nasal congestion, no sore throat Cardiovascular: no chest pain Respiratory:  no shortness of breath Gastrointestinal:  no abdominal pain, no change in bowel habits GU:  Female: negative for dysuria or pelvic pain Musculoskeletal/Extremities:  no pain of the joints Integumentary (Skin/Breast):  no abnormal skin lesions reported Neurologic:  no headaches Endocrine:  denies fatigue Hematologic/Lymphatic:  No areas of easy bleeding  Exam BP 122/78 (BP Location: Left Arm, Patient Position: Sitting, Cuff Size: Normal)   Pulse 83   Temp 98.4 F (36.9 C) (Oral)   Ht 5\' 7"  (1.702  m)   Wt 267 lb (121.1 kg)   SpO2 98%   BMI 41.82 kg/m  General:  well developed, well nourished, in no apparent distress Skin:  no significant moles, warts, or growths Head:  no masses, lesions, or tenderness Eyes:  pupils equal and round, sclera anicteric without injection Ears:  canals without lesions, TMs shiny without retraction, no obvious effusion, no erythema Nose:  nares patent, septum midline, mucosa normal, and no drainage or sinus tenderness Throat/Pharynx:  lips and gingiva without lesion; tongue and uvula midline; non-inflamed pharynx; no exudates or postnasal drainage Neck: neck supple without adenopathy, thyromegaly, or masses Lungs:  clear to auscultation, breath sounds equal bilaterally, no respiratory distress Cardio:  regular rate and rhythm, no LE edema Abdomen:  abdomen soft, nontender; bowel sounds normal; no masses or organomegaly Genital: Defer to GYN Musculoskeletal:  symmetrical muscle groups noted without atrophy or deformity Extremities:  no clubbing, cyanosis, or edema, no deformities, no skin discoloration Neuro:  gait normal; deep tendon reflexes normal and symmetric Psych: well oriented with normal range of affect and appropriate judgment/insight  Assessment and Plan  Well adult exam - Plan: Lipid panel, Comprehensive metabolic panel  Encounter for hepatitis C screening test for low risk patient - Plan: Hepatitis C antibody   Well 40 y.o. female. Counseled on diet and exercise. Other orders as above.  Shared decision making about breast cancer screening. Decides to forego screening at this time. GYN info given in paperwork.  Follow up 6 mo.  The patient voiced understanding and agreement to the plan.  Jilda Roche [place], DO 01/24/20 11:03 AM

## 2020-01-24 NOTE — Patient Instructions (Addendum)
Call Center for [facility] at [facility] at [phone] for an appointment.  They are located at [address], [address], [place], [place], [address] (right across the hall from our office).  Give [place] 2-3 business days to get the results of your labs back.   Keep the diet clean and stay active.  Continue to monitor blood pressure at home. Goal is <140/90. If over the next 4-6 weeks, we are not consistently at goal, please reach out.   Let us know if you need anything.

## 2020-01-25 DIAGNOSIS — M6240 Contracture of muscle, unspecified site: Secondary | ICD-10-CM | POA: Diagnosis not present

## 2020-01-25 DIAGNOSIS — M9901 Segmental and somatic dysfunction of cervical region: Secondary | ICD-10-CM | POA: Diagnosis not present

## 2020-01-25 DIAGNOSIS — M9902 Segmental and somatic dysfunction of thoracic region: Secondary | ICD-10-CM | POA: Diagnosis not present

## 2020-01-25 DIAGNOSIS — M546 Pain in thoracic spine: Secondary | ICD-10-CM | POA: Diagnosis not present

## 2020-01-25 LAB — HEPATITIS C ANTIBODY
Hepatitis C Ab: NONREACTIVE
SIGNAL TO CUT-OFF: 0.01 (ref <1.00)

## 2020-02-21 DIAGNOSIS — M9905 Segmental and somatic dysfunction of pelvic region: Secondary | ICD-10-CM | POA: Diagnosis not present

## 2020-02-21 DIAGNOSIS — M9901 Segmental and somatic dysfunction of cervical region: Secondary | ICD-10-CM | POA: Diagnosis not present

## 2020-02-21 DIAGNOSIS — M5386 Other specified dorsopathies, lumbar region: Secondary | ICD-10-CM | POA: Diagnosis not present

## 2020-02-21 DIAGNOSIS — M9903 Segmental and somatic dysfunction of lumbar region: Secondary | ICD-10-CM | POA: Diagnosis not present

## 2020-03-06 DIAGNOSIS — M546 Pain in thoracic spine: Secondary | ICD-10-CM | POA: Diagnosis not present

## 2020-03-06 DIAGNOSIS — M9902 Segmental and somatic dysfunction of thoracic region: Secondary | ICD-10-CM | POA: Diagnosis not present

## 2020-03-06 DIAGNOSIS — M9901 Segmental and somatic dysfunction of cervical region: Secondary | ICD-10-CM | POA: Diagnosis not present

## 2020-03-06 DIAGNOSIS — M6240 Contracture of muscle, unspecified site: Secondary | ICD-10-CM | POA: Diagnosis not present

## 2020-03-14 ENCOUNTER — Other Ambulatory Visit: Payer: Self-pay | Admitting: Family Medicine

## 2020-03-14 DIAGNOSIS — I10 Essential (primary) hypertension: Secondary | ICD-10-CM

## 2020-03-20 ENCOUNTER — Other Ambulatory Visit: Payer: Self-pay

## 2020-03-20 ENCOUNTER — Other Ambulatory Visit (INDEPENDENT_AMBULATORY_CARE_PROVIDER_SITE_OTHER): Payer: BC Managed Care – PPO

## 2020-03-20 DIAGNOSIS — M5386 Other specified dorsopathies, lumbar region: Secondary | ICD-10-CM | POA: Diagnosis not present

## 2020-03-20 DIAGNOSIS — E785 Hyperlipidemia, unspecified: Secondary | ICD-10-CM

## 2020-03-20 DIAGNOSIS — R739 Hyperglycemia, unspecified: Secondary | ICD-10-CM | POA: Diagnosis not present

## 2020-03-20 DIAGNOSIS — M9901 Segmental and somatic dysfunction of cervical region: Secondary | ICD-10-CM | POA: Diagnosis not present

## 2020-03-20 DIAGNOSIS — M9905 Segmental and somatic dysfunction of pelvic region: Secondary | ICD-10-CM | POA: Diagnosis not present

## 2020-03-20 DIAGNOSIS — M9903 Segmental and somatic dysfunction of lumbar region: Secondary | ICD-10-CM | POA: Diagnosis not present

## 2020-03-21 LAB — COMPREHENSIVE METABOLIC PANEL
ALT: 60 U/L — ABNORMAL HIGH (ref 0–35)
AST: 37 U/L (ref 0–37)
Albumin: 4 g/dL (ref 3.5–5.2)
Alkaline Phosphatase: 63 U/L (ref 39–117)
BUN: 15 mg/dL (ref 6–23)
CO2: 25 mEq/L (ref 19–32)
Calcium: 9.3 mg/dL (ref 8.4–10.5)
Chloride: 103 mEq/L (ref 96–112)
Creatinine, Ser: 0.89 mg/dL (ref 0.40–1.20)
GFR: 80.84 mL/min (ref >60.00)
Glucose, Bld: 92 mg/dL (ref 70–99)
Potassium: 4.2 mEq/L (ref 3.5–5.1)
Sodium: 137 mEq/L (ref 135–145)
Total Bilirubin: 0.4 mg/dL (ref 0.2–1.2)
Total Protein: 6.9 g/dL (ref 6.0–8.3)

## 2020-03-21 LAB — LIPID PANEL
Cholesterol: 201 mg/dL — ABNORMAL HIGH (ref 0–200)
HDL: 43.5 mg/dL (ref >39.00)
NonHDL: 157.23
Total CHOL/HDL Ratio: 5
Triglycerides: 215 mg/dL — ABNORMAL HIGH (ref 0.0–149.0)
VLDL: 43 mg/dL — ABNORMAL HIGH (ref 0.0–40.0)

## 2020-03-21 LAB — LDL CHOLESTEROL, DIRECT: Direct LDL: 137 mg/dL

## 2020-03-22 ENCOUNTER — Other Ambulatory Visit: Payer: Self-pay | Admitting: Family Medicine

## 2020-03-22 DIAGNOSIS — R7401 Elevation of levels of liver transaminase levels: Secondary | ICD-10-CM

## 2020-03-28 ENCOUNTER — Other Ambulatory Visit: Payer: Self-pay

## 2020-03-28 ENCOUNTER — Ambulatory Visit (HOSPITAL_BASED_OUTPATIENT_CLINIC_OR_DEPARTMENT_OTHER)
Admission: RE | Admit: 2020-03-28 | Discharge: 2020-03-28 | Disposition: A | Discharge status: Home / Self Care | Payer: BC Managed Care – PPO | Source: Ambulatory Visit | Attending: Family Medicine | Admitting: Family Medicine

## 2020-03-28 DIAGNOSIS — R7401 Elevation of levels of liver transaminase levels: Secondary | ICD-10-CM | POA: Diagnosis not present

## 2020-03-28 DIAGNOSIS — K760 Fatty (change of) liver, not elsewhere classified: Secondary | ICD-10-CM | POA: Diagnosis not present

## 2020-03-28 DIAGNOSIS — K8020 Calculus of gallbladder without cholecystitis without obstruction: Secondary | ICD-10-CM | POA: Diagnosis not present

## 2020-03-28 DIAGNOSIS — R945 Abnormal results of liver function studies: Secondary | ICD-10-CM | POA: Diagnosis not present

## 2020-03-29 ENCOUNTER — Telehealth: Payer: Self-pay

## 2020-03-29 ENCOUNTER — Encounter: Payer: Self-pay | Admitting: Family Medicine

## 2020-03-29 DIAGNOSIS — K760 Fatty (change of) liver, not elsewhere classified: Secondary | ICD-10-CM | POA: Insufficient documentation

## 2020-03-29 NOTE — Telephone Encounter (Signed)
Radiology called today with patients ultrasound results. -JMA

## 2020-04-04 DIAGNOSIS — M6240 Contracture of muscle, unspecified site: Secondary | ICD-10-CM | POA: Diagnosis not present

## 2020-04-04 DIAGNOSIS — M9901 Segmental and somatic dysfunction of cervical region: Secondary | ICD-10-CM | POA: Diagnosis not present

## 2020-04-04 DIAGNOSIS — M9902 Segmental and somatic dysfunction of thoracic region: Secondary | ICD-10-CM | POA: Diagnosis not present

## 2020-04-04 DIAGNOSIS — M546 Pain in thoracic spine: Secondary | ICD-10-CM | POA: Diagnosis not present

## 2020-04-08 DIAGNOSIS — I10 Essential (primary) hypertension: Secondary | ICD-10-CM

## 2020-04-08 MED ORDER — LABETALOL HCL 300 MG PO TABS: 300.0000 mg | ORAL_TABLET | Freq: Two times a day (BID) | ORAL | 2 refills | Status: DC

## 2020-04-08 MED ORDER — PANTOPRAZOLE SODIUM 40 MG PO TBEC
40.0000 mg | DELAYED_RELEASE_TABLET | Freq: Every day | ORAL | 3 refills | Status: DC
Start: 2020-04-08 — End: 2020-08-07

## 2020-05-02 DIAGNOSIS — M9902 Segmental and somatic dysfunction of thoracic region: Secondary | ICD-10-CM | POA: Diagnosis not present

## 2020-05-02 DIAGNOSIS — M6240 Contracture of muscle, unspecified site: Secondary | ICD-10-CM | POA: Diagnosis not present

## 2020-05-02 DIAGNOSIS — M9901 Segmental and somatic dysfunction of cervical region: Secondary | ICD-10-CM | POA: Diagnosis not present

## 2020-05-02 DIAGNOSIS — M546 Pain in thoracic spine: Secondary | ICD-10-CM | POA: Diagnosis not present

## 2020-05-02 DIAGNOSIS — M9903 Segmental and somatic dysfunction of lumbar region: Secondary | ICD-10-CM | POA: Diagnosis not present

## 2020-05-02 DIAGNOSIS — M9905 Segmental and somatic dysfunction of pelvic region: Secondary | ICD-10-CM | POA: Diagnosis not present

## 2020-05-02 DIAGNOSIS — M5386 Other specified dorsopathies, lumbar region: Secondary | ICD-10-CM | POA: Diagnosis not present

## 2020-05-08 ENCOUNTER — Ambulatory Visit (INDEPENDENT_AMBULATORY_CARE_PROVIDER_SITE_OTHER): Payer: BC Managed Care – PPO | Admitting: Family Medicine

## 2020-05-08 ENCOUNTER — Other Ambulatory Visit: Payer: Self-pay

## 2020-05-08 ENCOUNTER — Encounter: Payer: Self-pay | Admitting: Family Medicine

## 2020-05-08 VITALS — BP 130/82 | HR 89 | Temp 98.7°F | Ht 67.0 in | Wt 269.1 lb

## 2020-05-08 DIAGNOSIS — I10 Essential (primary) hypertension: Secondary | ICD-10-CM | POA: Diagnosis not present

## 2020-05-08 NOTE — Patient Instructions (Signed)
Keep the diet clean and stay active.  Because your blood pressure is well-controlled, you no longer have to check your blood pressure at home anymore unless you wish. Some people check it twice daily every day and some people stop altogether. Either or anything in between is fine. Strong work!  Let us know if you need anything. 

## 2020-05-08 NOTE — Progress Notes (Signed)
Chief Complaint  Patient presents with  . Form Completion    Subjective Yesenia Cunningham is a 40 y.o. female who presents for hypertension follow up. She does monitor home blood pressures. Blood pressures ranging from 120-130's/70's on average. She is compliant with medication- labetalol 300 mg/d. Patient has these side effects of medication: none She is usually adhering to a healthy diet overall. Current exercise: walking, yard work No CP or SOB.  Needs a form filled out for work.    Past Medical History:  Diagnosis Date  . Allergy   . NAFLD (nonalcoholic fatty liver disease)     Exam BP 130/82 (BP Location: Left Arm, Patient Position: Sitting, Cuff Size: Normal)   Pulse 89   Temp 98.7 F (37.1 C) (Oral)   Ht 5\' 7"  (1.702 m)   Wt 269 lb 2 oz (122.1 kg)   SpO2 99%   BMI 42.15 kg/m  General:  well developed, well nourished, in no apparent distress Heart: RRR, no bruits, no LE edema Lungs: clear to auscultation, no accessory muscle use Psych: well oriented with normal range of affect and appropriate judgment/insight  Essential hypertension  Controlled, cont Labetalol 300 mg/d. Can stop checking BP. Counseled on diet and exercise. FMLA form filled out. Excusing 2-3 times per mo, 4 hrs per episode.  F/u as originally scheduled. The patient voiced understanding and agreement to the plan.  [place], DO 05/08/20  3:09 PM

## 2020-06-20 DIAGNOSIS — M5386 Other specified dorsopathies, lumbar region: Secondary | ICD-10-CM | POA: Diagnosis not present

## 2020-06-20 DIAGNOSIS — M9901 Segmental and somatic dysfunction of cervical region: Secondary | ICD-10-CM | POA: Diagnosis not present

## 2020-06-20 DIAGNOSIS — M9905 Segmental and somatic dysfunction of pelvic region: Secondary | ICD-10-CM | POA: Diagnosis not present

## 2020-06-20 DIAGNOSIS — M9903 Segmental and somatic dysfunction of lumbar region: Secondary | ICD-10-CM | POA: Diagnosis not present

## 2020-07-10 ENCOUNTER — Other Ambulatory Visit: Payer: Self-pay

## 2020-07-10 ENCOUNTER — Ambulatory Visit: Payer: BC Managed Care – PPO | Admitting: Family Medicine

## 2020-07-10 ENCOUNTER — Encounter: Payer: Self-pay | Admitting: Family Medicine

## 2020-07-10 ENCOUNTER — Telehealth: Payer: Self-pay | Admitting: Family Medicine

## 2020-07-10 VITALS — BP 142/98 | HR 74 | Temp 99.0°F | Ht 67.0 in | Wt 271.5 lb

## 2020-07-10 DIAGNOSIS — I10 Essential (primary) hypertension: Secondary | ICD-10-CM | POA: Diagnosis not present

## 2020-07-10 DIAGNOSIS — F411 Generalized anxiety disorder: Secondary | ICD-10-CM | POA: Diagnosis not present

## 2020-07-10 MED ORDER — VENLAFAXINE HCL ER 37.5 MG PO CP24: 37.5000 mg | ORAL_CAPSULE | Freq: Every day | ORAL | 2 refills | Status: DC

## 2020-07-10 MED ORDER — BUSPIRONE HCL 7.5 MG PO TABS: 7.5000 mg | ORAL_TABLET | Freq: Two times a day (BID) | ORAL | 2 refills | Status: DC

## 2020-07-10 NOTE — Progress Notes (Signed)
Chief Complaint  Patient presents with   Follow-up    Subjective Yesenia Cunningham is an 40 y.o. female who presents with anxiety/depression Symptoms began 1 mo ago worsening. Anxiety symptoms: difficulty concentrating, fatigue, feelings of losing control, insomnia, irritable, racing thoughts. Depressive symptoms depressed mood, anhedonia, hopelessness, impaired memory,. No self medication.  Social stressors include her job.  She is currently being treated with nothing, has never been on anything.  She is not following with a psychologist. Because of her newfound stress, she has been exercising less and eating more.  She has gained weight.  Her blood pressure starting to increase.  Past Medical History:  Diagnosis Date   Allergy    NAFLD (nonalcoholic fatty liver disease)      Family History Family History  Problem Relation Age of Onset   Hypertension Mother    Cancer Father        kidney cancer   Heart disease Father    Cancer Maternal Grandmother        uterine cancer   Heart attack Maternal Grandfather    Stroke Paternal Grandmother     Exam BP (!) 142/98   Pulse 74   Temp 99 F (37.2 C) (Oral)   Ht 5\' 7"  (1.702 m)   Wt 271 lb 8 oz (123.2 kg)   SpO2 95%   BMI 42.52 kg/m  General:  well developed, well nourished, in no apparent distress Lungs:  normal respiratory effort without accessory muscle use Psych: well oriented with normal range of affect and age-appropriate judgement/insight  Assessment and Plan  GAD (generalized anxiety disorder) - Plan: venlafaxine XR (EFFEXOR XR) 37.5 MG 24 hr capsule, busPIRone (BUSPAR) 7.5 MG tablet  Essential hypertension  Start on both Effexor and BuSpar.  She plans on moving eventually.  Hopefully the BuSpar gives her more immediate relief while the Effexor kicks in.  I think this will help with her migraines as well.  We can lower her stress levels, I think we can also lower her blood pressure.  She agrees to hold off on  adjusting her blood pressure medication. Counseled on adjunctive treatment with exercise/physical activity. Number for [facility] Counseling provided in AVS. F/u in 1 mo. Patient voiced understanding and agreement to the plan.  [facility] [place], DO 07/10/20 4:55 PM

## 2020-07-10 NOTE — Patient Instructions (Signed)
Aim to do some physical exertion for 150 minutes per week. This is typically divided into 5 days per week, 30 minutes per day. The activity should be enough to get your heart rate up. Anything is better than nothing if you have time constraints.  Please consider counseling. Contact [phone] to schedule an appointment or inquire about cost/insurance coverage.  Sleep Hygiene Tips: Do not watch TV or look at screens within 1 hour of going to bed. If you do, make sure there is a blue light filter (nighttime mode) involved. Try to go to bed around the same time every night. Wake up at the same time within 1 hour of regular time. Ex: If you wake up at 7 AM for work, do not sleep past 8 AM on days that you don't work. Do not drink alcohol before bedtime. Do not consume caffeine-containing beverages after noon or within 9 hours of intended bedtime. Get regular exercise/physical activity in your life, but not within 2 hours of planned bedtime. Do not take naps.  Do not eat within 2 hours of planned bedtime. Melatonin, 3-5 mg 30-60 minutes before planned bedtime may be helpful.  The bed should be for sleep or sex only. If after 20-30 minutes you are unable to fall asleep, get up and do something relaxing. Do this until you feel ready to go to sleep again.   Coping skills Choose 5 that work for you: Take a deep breath Count to 20 Read a book Do a puzzle Meditate Bake Sing Knit Garden Pray Go outside Call a friend Listen to music Take a walk Color Send a note Take a bath Watch a movie Be alone in a quiet place Pet an animal Visit a friend Journal Exercise Stretch

## 2020-07-10 NOTE — Telephone Encounter (Signed)
PCP completed FMLA Faxed to [employer] at [phone] Recevied fax confirmation Mailed a copy to the patient Sent to scan///labeled.

## 2020-07-23 ENCOUNTER — Ambulatory Visit: Payer: BC Managed Care – PPO | Admitting: Family Medicine

## 2020-07-24 ENCOUNTER — Ambulatory Visit: Payer: BC Managed Care – PPO | Admitting: Family Medicine

## 2020-08-02 ENCOUNTER — Other Ambulatory Visit: Payer: Self-pay | Admitting: Family Medicine

## 2020-08-02 DIAGNOSIS — F411 Generalized anxiety disorder: Secondary | ICD-10-CM

## 2020-08-07 ENCOUNTER — Ambulatory Visit: Payer: BC Managed Care – PPO | Admitting: Family Medicine

## 2020-08-07 ENCOUNTER — Other Ambulatory Visit: Payer: Self-pay

## 2020-08-07 ENCOUNTER — Encounter: Payer: Self-pay | Admitting: Family Medicine

## 2020-08-07 VITALS — BP 132/86 | HR 74 | Temp 98.5°F | Ht 67.0 in | Wt 270.2 lb

## 2020-08-07 DIAGNOSIS — I10 Essential (primary) hypertension: Secondary | ICD-10-CM

## 2020-08-07 DIAGNOSIS — F411 Generalized anxiety disorder: Secondary | ICD-10-CM | POA: Diagnosis not present

## 2020-08-07 MED ORDER — LABETALOL HCL 300 MG PO TABS: 300.0000 mg | ORAL_TABLET | Freq: Two times a day (BID) | ORAL | 2 refills | Status: DC

## 2020-08-07 MED ORDER — BUSPIRONE HCL 7.5 MG PO TABS: 7.5000 mg | ORAL_TABLET | Freq: Two times a day (BID) | ORAL | 2 refills | Status: AC

## 2020-08-07 MED ORDER — PANTOPRAZOLE SODIUM 40 MG PO TBEC
40.0000 mg | DELAYED_RELEASE_TABLET | Freq: Every day | ORAL | 2 refills | Status: DC
Start: 2020-08-07 — End: 2020-10-09

## 2020-08-07 NOTE — Progress Notes (Signed)
Chief Complaint  Patient presents with   Follow-up    Subjective Yesenia Cunningham presents for f/u anxiety.  Pt is currently being treated with BuSpar 7.5 mg bid.  Reports doing well since treatment. Effexor provided N/V and lightheadedness so she stopped w resolution of those s/s's.  No thoughts of harming self or others. No self-medication with alcohol, prescription drugs or illicit drugs. Pt is not following with a counselor/psychologist.  Hypertension Patient presents for hypertension follow up. She does monitor home blood pressures. She is compliant with medication- labetalol 300 mg bid. Patient has these side effects of medication: none She is usually adhering to a healthy diet overall. Exercise: walking No Cp or SOB.  Past Medical History:  Diagnosis Date   Allergy    NAFLD (nonalcoholic fatty liver disease)    Allergies as of 08/07/2020       Reactions   Nsaids    Stevens-Johnson Syndrome   Bacitracin Other (See Comments)   Sulfa Antibiotics Nausea And Vomiting        Medication List        Accurate as of August 07, 2020  2:00 PM. If you have any questions, ask your nurse or doctor.          STOP taking these medications    venlafaxine XR 37.5 MG 24 hr capsule Commonly known as: EFFEXOR-XR Stopped by: Sharlene Dory, DO       TAKE these medications    busPIRone 7.5 MG tablet Commonly known as: BUSPAR TAKE 1 TABLET BY MOUTH 2 TIMES DAILY.   famotidine 40 MG tablet Commonly known as: PEPCID Take 1 tablet (40 mg total) by mouth daily.   labetalol 300 MG tablet Commonly known as: NORMODYNE Take 1 tablet (300 mg total) by mouth 2 (two) times daily.   pantoprazole 40 MG tablet Commonly known as: PROTONIX Take 1 tablet (40 mg total) by mouth daily.        Exam BP 132/86   Pulse 74   Temp 98.5 F (36.9 C) (Oral)   Ht 5\' 7"  (1.702 m)   Wt 270 lb 4 oz (122.6 kg)   SpO2 98%   BMI 42.33 kg/m  General:  well developed, well  nourished, in no apparent distress Lungs:  No respiratory distress Psych: well oriented with normal range of affect and age-appropriate judgement/insight, alert and oriented x4.  Assessment and Plan  GAD (generalized anxiety disorder)  Essential hypertension  Chronic, stable. Cont BuSpar 7.5 mg bid.  Chronic, stable. Cont Labetalol 300 mg bid. Does not need to monitor at home. Counseled on diet/exercise. F/u in 6 mo for CPE- she is moving soon to [place] or [place] and this will likely be our last visit. The patient voiced understanding and agreement to the plan.  [place], DO 08/07/20 2:00 PM

## 2020-08-07 NOTE — Patient Instructions (Addendum)
Good luck in the future. If you need some meds sent in while in [facility] until you get a new PCP, let me know.  Keep the diet clean and stay active.  Because your blood pressure is well-controlled, you no longer have to check your blood pressure at home anymore unless you wish. Some people check it twice daily every day and some people stop altogether. Either or anything in between is fine. Strong work!  Aim to do some physical exertion for 150 minutes per week. This is typically divided into 5 days per week, 30 minutes per day. The activity should be enough to get your heart rate up. Anything is better than nothing if you have time constraints.  Let us know if you need anything.

## 2020-08-14 ENCOUNTER — Encounter: Payer: Self-pay | Admitting: Family Medicine

## 2020-08-14 ENCOUNTER — Telehealth: Payer: Self-pay | Admitting: Family Medicine

## 2020-08-14 ENCOUNTER — Ambulatory Visit (INDEPENDENT_AMBULATORY_CARE_PROVIDER_SITE_OTHER): Payer: BC Managed Care – PPO | Admitting: Family Medicine

## 2020-08-14 ENCOUNTER — Other Ambulatory Visit: Payer: Self-pay

## 2020-08-14 VITALS — BP 119/76 | HR 92 | Temp 98.5°F | Ht 67.0 in | Wt 270.5 lb

## 2020-08-14 DIAGNOSIS — F411 Generalized anxiety disorder: Secondary | ICD-10-CM

## 2020-08-14 NOTE — Telephone Encounter (Signed)
PCP completed updated FMLA Faxed to [employer] form-Claim#[id number] I [phone] -- received fax confirmation. Will send to scan

## 2020-08-14 NOTE — Progress Notes (Signed)
Chief Complaint  Patient presents with   Follow-up    Update FMLA    Subjective: Patient is a 40 y.o. female here for anxiety f/u.  Patient is following up for anxiety.  She needs her FMLA forms updated.  She is currently set for 5 episodes per month, 3 days per episode.  She is requesting 8 episodes per month with 2 days per episode.  She doubts she will use it entirely but the way they have been telling her episodes has been frustrating for her.  Things have been stable on her current medicine regimen despite stress at work and she is in the process of moving.  Past Medical History:  Diagnosis Date   Allergy    NAFLD (nonalcoholic fatty liver disease)     Objective: BP 119/76   Pulse 92   Temp 98.5 F (36.9 C) (Oral)   Ht 5\' 7"  (1.702 m)   Wt 270 lb 8 oz (122.7 kg)   SpO2 94%   BMI 42.37 kg/m  General: Awake, appears stated age  Lungs: No accessory muscle use Psych: Age appropriate judgment and insight, normal affect and mood  Assessment and Plan: GAD (generalized anxiety disorder)  Form filled out again. Cont BuSpar 7.5 mg bid.  The patient voiced understanding and agreement to the plan.  [place], DO 08/14/20  4:09 PM

## 2020-09-13 ENCOUNTER — Other Ambulatory Visit: Payer: Self-pay | Admitting: Family Medicine

## 2020-09-13 DIAGNOSIS — I10 Essential (primary) hypertension: Secondary | ICD-10-CM

## 2020-10-09 DIAGNOSIS — I10 Essential (primary) hypertension: Secondary | ICD-10-CM

## 2020-10-09 MED ORDER — LABETALOL HCL 300 MG PO TABS: 300.0000 mg | ORAL_TABLET | Freq: Two times a day (BID) | ORAL | 2 refills | Status: DC

## 2020-10-09 MED ORDER — PANTOPRAZOLE SODIUM 40 MG PO TBEC
40.0000 mg | DELAYED_RELEASE_TABLET | Freq: Every day | ORAL | 2 refills | Status: DC
Start: 2020-10-09 — End: 2021-07-21

## 2021-01-29 ENCOUNTER — Encounter: Payer: BC Managed Care – PPO | Admitting: Family Medicine

## 2021-05-06 IMAGING — US US ABDOMEN LIMITED RUQ/ASCITES
1 series · 13 of 25 positions shown · non-contrast
Comparison: None.

CLINICAL DATA: Elevated liver enzymes

EXAM:
ULTRASOUND ABDOMEN LIMITED RIGHT UPPER QUADRANT

[Series 1: us abdomen limited ruq/ascites · 13 of 30 slices shown]
[im 1/30]
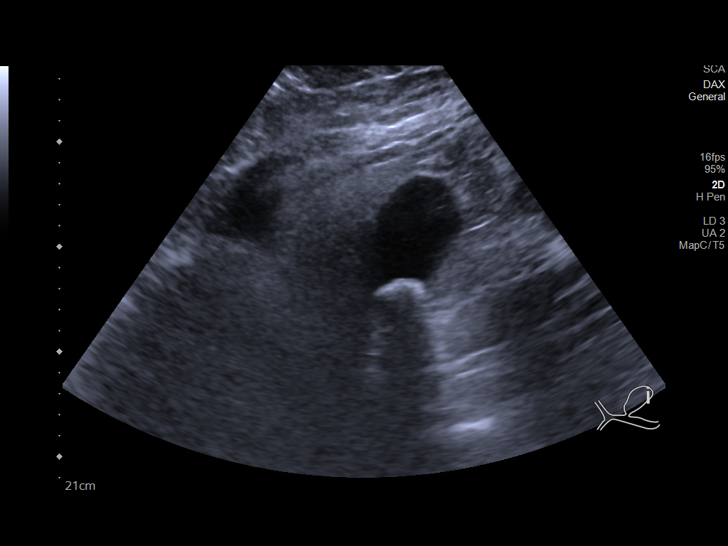
[im 3/30]
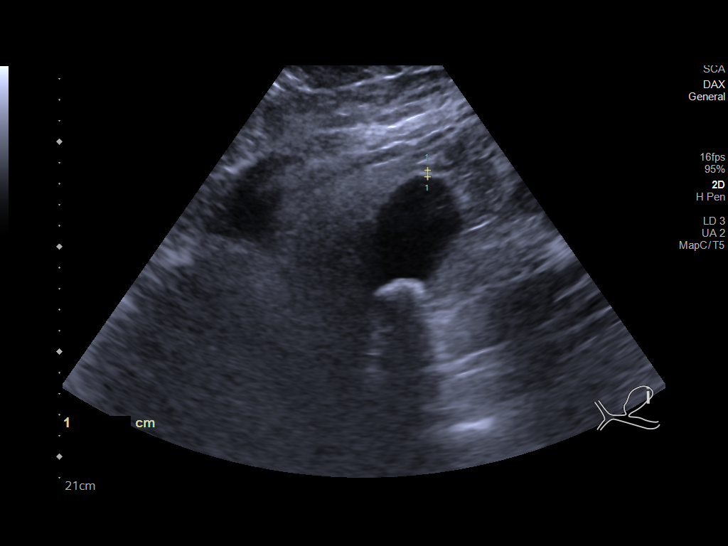
[im 5/30]
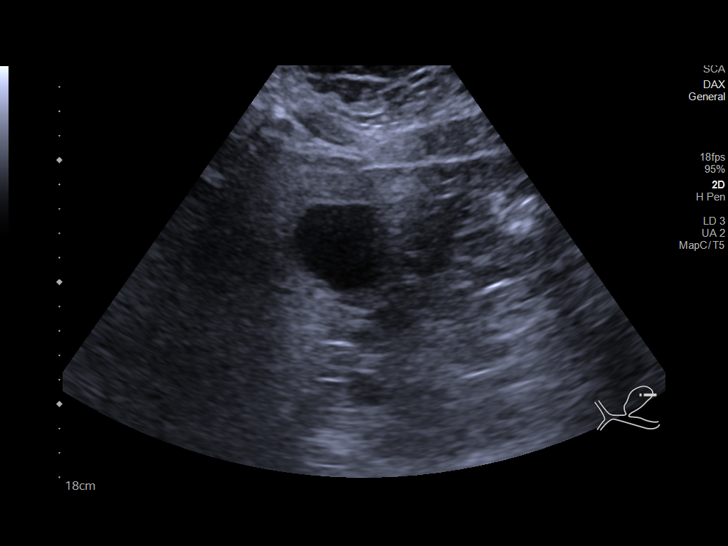
[im 8/30]
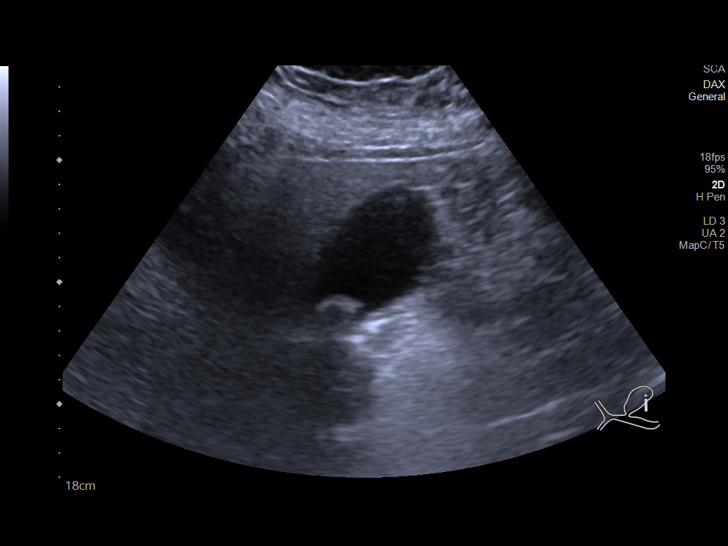
[im 10/30]
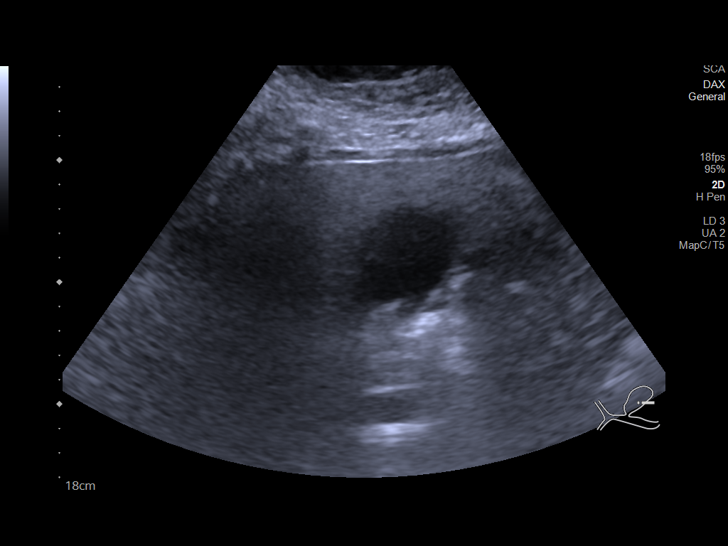
[im 13/30]
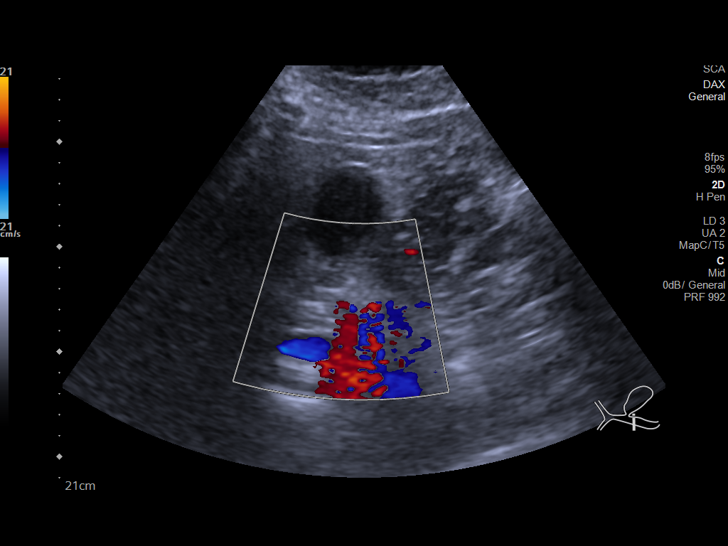
[im 15/30]
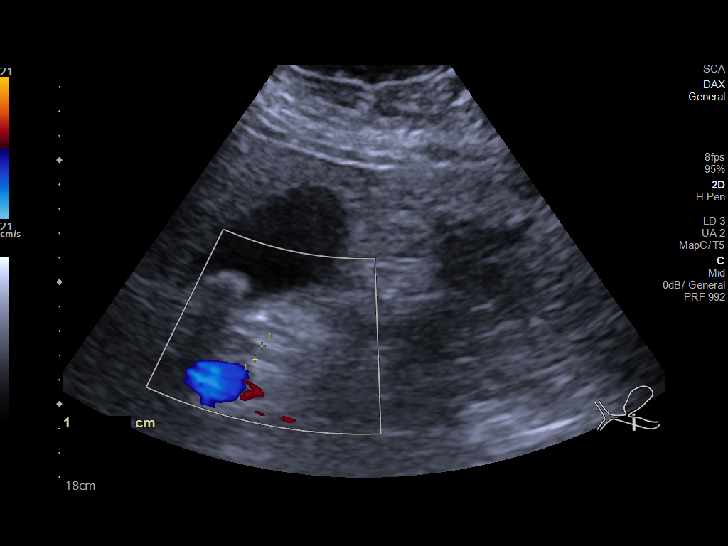
[im 17/30]
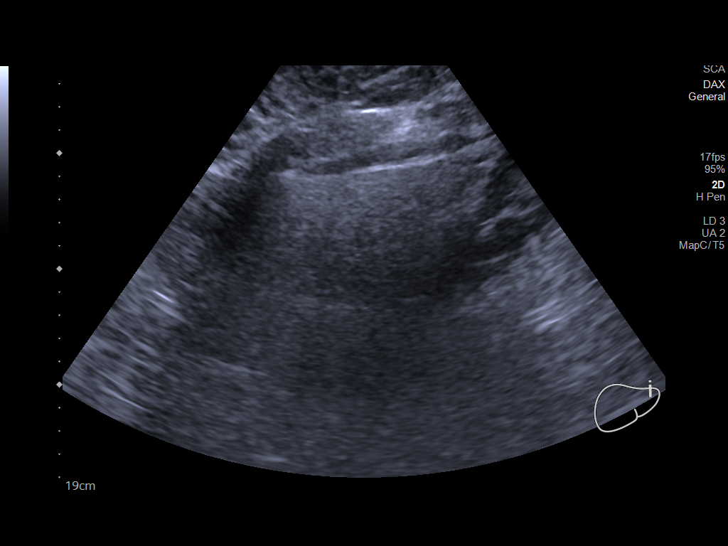
[im 20/30]
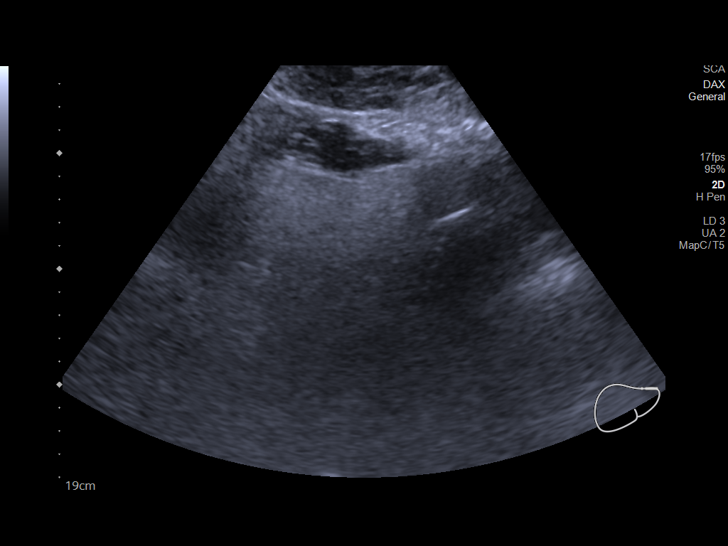
[im 22/30]
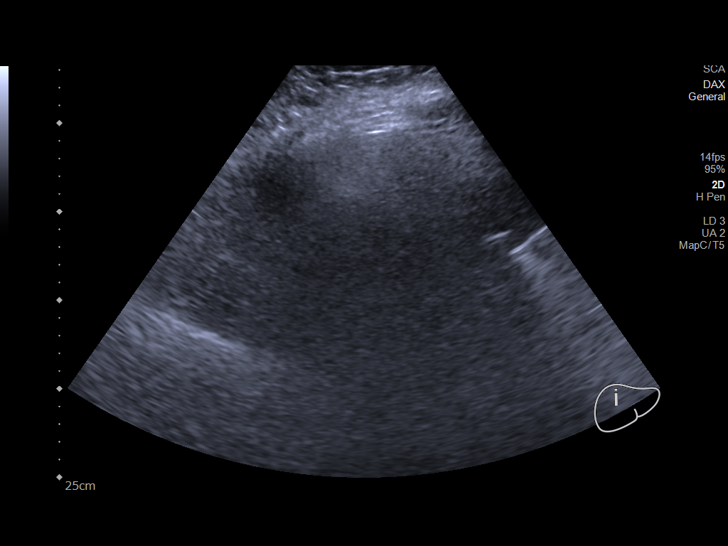
[im 25/30]
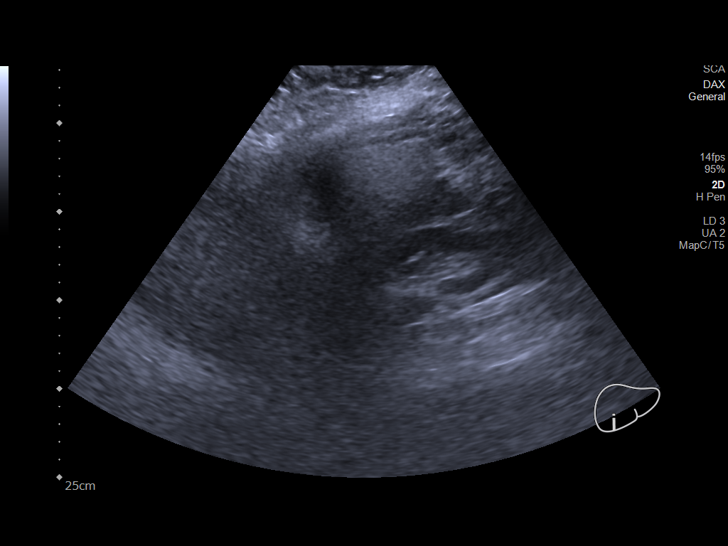
[im 27/30]
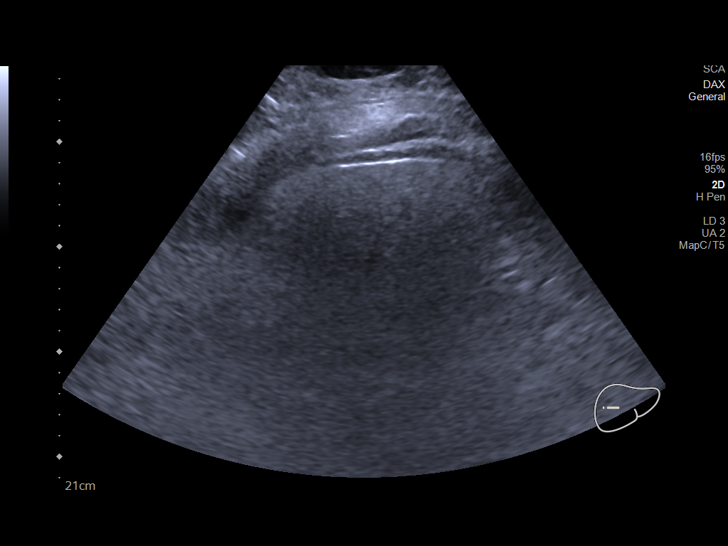
[im 30/30]
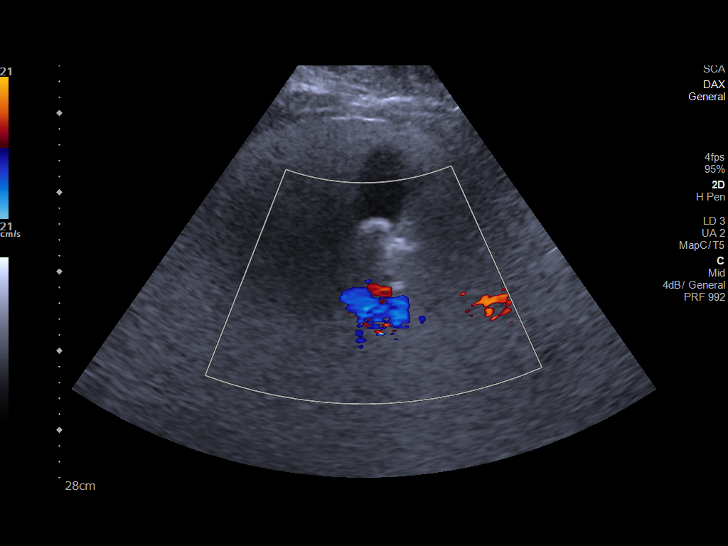

[13 of 25 positions shown; findings below may reference images not displayed]

FINDINGS: Gallbladder:

Within the gallbladder, there are echogenic foci which move and
shadow consistent with cholelithiasis. Largest gallstone measures
2.8 cm in length. There is no gallbladder wall thickening or
pericholecystic fluid. No sonographic Murphy sign noted by
sonographer.

Common bile duct:

Diameter: 6 mm. No demonstrable intrahepatic or extrahepatic biliary
duct dilatation. Note that intrahepatic duct visualization is
limited due to the underlying hepatic steatosis.

Liver:

No focal lesion identified. Liver echogenicity is increased
diffusely. Limited visualization of the portal veins due to
increased echogenicity in the liver leading to sound attenuation at
the level of the portal veins. Suggestion of reversal of flow in
visualized portal venous vessels.

Other: None.
IMPRESSION: 1. Cholelithiasis. No gallbladder wall thickening or pericholecystic
fluid.

2. Marked increased liver echogenicity, a finding indicative of
hepatic steatosis with potential underlying parenchymal liver
disease. No focal liver lesions are visualized; it must be cautioned
that sensitivity of ultrasound for detection of focal liver lesions
is diminished significantly in this circumstance.

3. Limited visualization of portal venous flow. A degree of
hepatofugal flow in the portal venous system is suggested although
not definitively established with this study.

These results will be called to the ordering clinician or
representative by the Radiologist Assistant, and communication
documented in the PACS or [REDACTED].

## 2021-07-18 ENCOUNTER — Encounter: Payer: Self-pay | Admitting: Family Medicine

## 2021-07-18 DIAGNOSIS — I10 Essential (primary) hypertension: Secondary | ICD-10-CM

## 2021-07-21 MED ORDER — PANTOPRAZOLE SODIUM 40 MG PO TBEC: 40.0000 mg | DELAYED_RELEASE_TABLET | Freq: Every day | ORAL | 2 refills | Status: DC

## 2021-07-21 MED ORDER — LABETALOL HCL 300 MG PO TABS: 300.0000 mg | ORAL_TABLET | Freq: Two times a day (BID) | ORAL | 2 refills | Status: AC

## 2022-05-12 ENCOUNTER — Other Ambulatory Visit: Payer: Self-pay | Admitting: Family Medicine

## 2022-05-15 ENCOUNTER — Other Ambulatory Visit: Payer: Self-pay | Admitting: Family Medicine

## 2022-05-15 DIAGNOSIS — I10 Essential (primary) hypertension: Secondary | ICD-10-CM

## 2022-05-15 NOTE — Telephone Encounter (Signed)
Last OV--08/14/2020 Looks like she may have moved. Tried to call her to confirm but cannot refill as too long since last OV

## 2022-08-12 ENCOUNTER — Other Ambulatory Visit: Payer: Self-pay | Admitting: Family Medicine

## 2022-08-12 NOTE — Telephone Encounter (Signed)
Last OV 2022 Patient has moved
